# Patient Record
Sex: Female | Born: 1995 | Race: Black or African American | Hispanic: No | Marital: Single | Smoking: Never smoker
Health system: Southern US, Community
[De-identification: ages and names within clinical notes are randomized; demographics above are authoritative.]

---

## 2017-03-03 ENCOUNTER — Encounter (HOSPITAL_COMMUNITY): Payer: Self-pay | Admitting: Emergency Medicine

## 2017-03-03 ENCOUNTER — Ambulatory Visit (HOSPITAL_COMMUNITY)
Admission: EM | Admit: 2017-03-03 | Discharge: 2017-03-03 | Disposition: A | Discharge status: Home / Self Care | Payer: BLUE CROSS/BLUE SHIELD | Attending: Internal Medicine | Admitting: Internal Medicine

## 2017-03-03 DIAGNOSIS — J069 Acute upper respiratory infection, unspecified: Secondary | ICD-10-CM

## 2017-03-03 DIAGNOSIS — B9789 Other viral agents as the cause of diseases classified elsewhere: Secondary | ICD-10-CM | POA: Diagnosis not present

## 2017-03-03 NOTE — Discharge Instructions (Signed)
Push fluids to ensure adequate hydration and keep secretions thin.  Tylenol and/or ibuprofen as needed for pain or fevers.  Continue with dayquil/nyquil or other over the counter treatments as needed to help with symptoms as your body continues to fight this virus. If symptoms worsen or do not improve in the next week to return to be seen or to follow up with your primary care provider.

## 2017-03-03 NOTE — ED Triage Notes (Signed)
PT C/O: cold sx onset 3 days associated w/vomiting, sore throat, hot flashes, nasal drainage/congestion, dry cough  TAKING MEDS: none   A&O x4... NAD... Ambulatory

## 2017-03-03 NOTE — ED Provider Notes (Signed)
[facility]    CSN: [id number] Arrival date & time: 03/03/17  1004     History   Chief Complaint Chief Complaint  Patient presents with  . URI    HPI Maureen Pitts is a 22 y.o. female.   Maureen Pitts presents with complaints of body aches, productive cough, sore throat which started two days ago but worsened yesterday. Had episode of vomiting last night. Denies abdominal pain or nausea today. Decreased appetite. No known fevers. Took nyquil which helped over night. Occasional headache but denies currently. Without ear pain. Has a nexplanon, no other medical history or medications. Classmates she has been around have had illness. Did not get a flu vaccine this season.   ROS per HPI.       History reviewed. No pertinent past medical history.  There are no active problems to display for this patient.   History reviewed. No pertinent surgical history.  OB History    No data available       Home Medications    Prior to Admission medications   Medication Sig Start Date End Date Taking? Authorizing Provider  Etonogestrel (NEXPLANON [place]) Inject into the skin.   Yes [provider]    Family History History reviewed. No pertinent family history.  Social History Social History   Tobacco Use  . Smoking status: Never Smoker  . Smokeless tobacco: Never Used  Substance Use Topics  . Alcohol use: Not on file  . Drug use: Not on file     Allergies   Patient has no known allergies.   Review of Systems Review of Systems   Physical Exam Triage Vital Signs ED Triage Vitals [03/03/17 1048]  Enc Vitals Group     BP 105/60     Pulse Rate 64     Resp 18     Temp 98.6 F (37 C)     Temp Source Oral     SpO2 100 %     Weight      Height      Head Circumference      Peak Flow      Pain Score      Pain Loc      Pain Edu?      Excl. in GC?    No data found.  Updated Vital Signs BP 105/60 (BP Location: Left Arm)   Pulse 64    Temp 98.6 F (37 C) (Oral)   Resp 18   SpO2 100%   Visual Acuity Right Eye Distance:   Left Eye Distance:   Bilateral Distance:    Right Eye Near:   Left Eye Near:    Bilateral Near:     Physical Exam  Constitutional: She is oriented to person, place, and time. She appears well-developed and well-nourished. No distress.  HENT:  Head: Normocephalic and atraumatic.  Right Ear: Tympanic membrane, external ear and ear canal normal.  Left Ear: Tympanic membrane, external ear and ear canal normal.  Nose: Nose normal.  Mouth/Throat: Uvula is midline, oropharynx is clear and moist and mucous membranes are normal. No tonsillar exudate.  Eyes: Conjunctivae and EOM are normal. Pupils are equal, round, and reactive to light.  Cardiovascular: Normal rate, regular rhythm and normal heart sounds.  Pulmonary/Chest: Effort normal and breath sounds normal.  Abdominal: There is no tenderness.  Lymphadenopathy:    She has no cervical adenopathy.  Neurological: She is alert and oriented to person, place, and time.  Skin: Skin is warm  and dry.     UC Treatments / Results  Labs (all labs ordered are listed, but only abnormal results are displayed) Labs Reviewed - No data to display  EKG  EKG Interpretation None       Radiology No results found.  Procedures Procedures (including critical care time)  Medications Ordered in UC Medications - No data to display   Initial Impression / Assessment and Plan / UC Course  I have reviewed the triage vital signs and the nursing notes.  Pertinent labs & imaging results that were available during my care of the patient were reviewed by me and considered in my medical decision making (see chart for details).     Without acute findings on exam. Non toxic in appearance. Afebrile, without tachypnea, tachycardia or hypoxia. History and physical consistent with viral illness.  Supportive cares recommended. Patient verbalized understanding and  agreeable to plan.    Final Clinical Impressions(s) / UC Diagnoses   Final diagnoses:  Viral URI with cough    ED Discharge Orders    None       Controlled Substance Prescriptions [place] Controlled Substance Registry consulted? Not Applicable   Georgetta Haber, NP 03/03/17 1158

## 2019-04-05 ENCOUNTER — Ambulatory Visit: Payer: BLUE CROSS/BLUE SHIELD | Attending: Family

## 2019-04-05 DIAGNOSIS — Z23 Encounter for immunization: Secondary | ICD-10-CM

## 2019-04-05 NOTE — Progress Notes (Signed)
   Covid-19 Vaccination Clinic  Name:  Maureen Pitts    MRN: [id number] DOB: [birth year]  04/05/2019  [name] was observed post Covid-19 immunization for 15 minutes without incident. She was provided with Vaccine Information Sheet and instruction to access the V-Safe system.   [name] was instructed to call 911 with any severe reactions post vaccine: Marland Kitchen Difficulty breathing  . Swelling of face and throat  . A fast heartbeat  . A bad rash all over body  . Dizziness and weakness   Immunizations Administered    Name Date Dose VIS Date Route   Moderna COVID-19 Vaccine 04/05/2019  3:41 PM 0.5 mL 12/19/2018 Intramuscular   Manufacturer: Moderna   Lot: [id number]   NDC: 84132-440-10

## 2019-05-08 ENCOUNTER — Ambulatory Visit: Payer: BLUE CROSS/BLUE SHIELD | Attending: Family

## 2019-05-08 DIAGNOSIS — Z23 Encounter for immunization: Secondary | ICD-10-CM

## 2019-05-08 NOTE — Progress Notes (Signed)
   Covid-19 Vaccination Clinic  Name:  Jessey Huyett    MRN: [id number] DOB: Jun 05, 1995  05/08/2019  Ms. Raphael was observed post Covid-19 immunization for 15 minutes without incident. She was provided with Vaccine Information Sheet and instruction to access the V-Safe system.   Ms. Kluth was instructed to call 911 with any severe reactions post vaccine: Marland Kitchen Difficulty breathing  . Swelling of face and throat  . A fast heartbeat  . A bad rash all over body  . Dizziness and weakness   Immunizations Administered    Name Date Dose VIS Date Route   Moderna COVID-19 Vaccine 05/08/2019  3:29 PM 0.5 mL 12/2018 Intramuscular   Manufacturer: Moderna   Lot: [id number]   NDC: 20891-002-62

## 2020-01-24 ENCOUNTER — Telehealth: Payer: Self-pay | Admitting: General Practice

## 2020-01-24 NOTE — Telephone Encounter (Signed)
Patient sent e-mail request to establish care here at our office. LVM to give the office a call to schedule.

## 2020-01-29 ENCOUNTER — Ambulatory Visit: Payer: BLUE CROSS/BLUE SHIELD | Attending: Internal Medicine

## 2020-01-29 DIAGNOSIS — Z23 Encounter for immunization: Secondary | ICD-10-CM

## 2020-01-29 NOTE — Progress Notes (Signed)
   Covid-19 Vaccination Clinic  Name:  Maureen Pitts    MRN: [id number] DOB: [birth year]  01/29/2020  Maureen Pitts was observed post Covid-19 immunization for 15 minutes without incident. She was provided with Vaccine Information Sheet and instruction to access the V-Safe system.   Maureen Pitts was instructed to call 911 with any severe reactions post vaccine: Marland Kitchen Difficulty breathing  . Swelling of face and throat  . A fast heartbeat  . A bad rash all over body  . Dizziness and weakness   Immunizations Administered    Name Date Dose VIS Date Route   Moderna COVID-19 Vaccine 01/29/2020  3:50 PM 0.5 mL 11/07/2019 Intramuscular   Manufacturer: Gala Murdoch   Lot: [id number]   NDC: 94801-655-37

## 2020-03-15 ENCOUNTER — Emergency Department (HOSPITAL_COMMUNITY)
Admission: EM | Admit: 2020-03-15 | Discharge: 2020-03-16 | Disposition: A | Discharge status: Home / Self Care | Payer: BLUE CROSS/BLUE SHIELD | Attending: Emergency Medicine | Admitting: Emergency Medicine

## 2020-03-15 ENCOUNTER — Emergency Department (HOSPITAL_COMMUNITY): Payer: BLUE CROSS/BLUE SHIELD

## 2020-03-15 ENCOUNTER — Encounter (HOSPITAL_COMMUNITY): Payer: Self-pay | Admitting: Emergency Medicine

## 2020-03-15 ENCOUNTER — Other Ambulatory Visit: Payer: Self-pay

## 2020-03-15 DIAGNOSIS — S8991XA Unspecified injury of right lower leg, initial encounter: Secondary | ICD-10-CM | POA: Diagnosis present

## 2020-03-15 DIAGNOSIS — S86911A Strain of unspecified muscle(s) and tendon(s) at lower leg level, right leg, initial encounter: Secondary | ICD-10-CM | POA: Diagnosis not present

## 2020-03-15 DIAGNOSIS — Y92410 Unspecified street and highway as the place of occurrence of the external cause: Secondary | ICD-10-CM | POA: Insufficient documentation

## 2020-03-15 DIAGNOSIS — T148XXA Other injury of unspecified body region, initial encounter: Secondary | ICD-10-CM

## 2020-03-15 DIAGNOSIS — S8011XA Contusion of right lower leg, initial encounter: Secondary | ICD-10-CM
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
## 2020-03-17 ENCOUNTER — Encounter (HOSPITAL_COMMUNITY): Payer: Self-pay

## 2020-03-17 ENCOUNTER — Other Ambulatory Visit: Payer: Self-pay

## 2020-03-17 ENCOUNTER — Emergency Department (HOSPITAL_COMMUNITY)
Admission: EM | Admit: 2020-03-17 | Discharge: 2020-03-17 | Disposition: A | Discharge status: Home / Self Care | Payer: BLUE CROSS/BLUE SHIELD | Attending: Emergency Medicine | Admitting: Emergency Medicine

## 2020-03-17 ENCOUNTER — Emergency Department (HOSPITAL_COMMUNITY): Payer: BLUE CROSS/BLUE SHIELD

## 2020-03-17 DIAGNOSIS — R519 Headache, unspecified: Secondary | ICD-10-CM | POA: Diagnosis not present

## 2020-03-17 DIAGNOSIS — Y92410 Unspecified street and highway as the place of occurrence of the external cause: Secondary | ICD-10-CM | POA: Insufficient documentation

## 2020-03-17 DIAGNOSIS — M25512 Pain in left shoulder: Secondary | ICD-10-CM | POA: Diagnosis not present

## 2020-03-17 DIAGNOSIS — R0781 Pleurodynia: Secondary | ICD-10-CM | POA: Insufficient documentation

## 2020-03-17 DIAGNOSIS — R42 Dizziness and giddiness: Secondary | ICD-10-CM | POA: Insufficient documentation

## 2020-03-17 DIAGNOSIS — M898X1 Other specified disorders of bone, shoulder: Secondary | ICD-10-CM
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
## 2020-03-17 NOTE — Discharge Instructions (Addendum)
Take Motrin Tylenol as needed as directed for pain. Apply warm compresses to sore muscles.  Follow-up with your doctor if pain persists.

## 2020-03-18 ENCOUNTER — Emergency Department (HOSPITAL_COMMUNITY)
Admission: EM | Admit: 2020-03-18 | Discharge: 2020-03-18 | Disposition: A | Discharge status: Home / Self Care | Payer: BLUE CROSS/BLUE SHIELD | Attending: Emergency Medicine | Admitting: Emergency Medicine

## 2020-03-18 ENCOUNTER — Emergency Department (HOSPITAL_COMMUNITY): Payer: BLUE CROSS/BLUE SHIELD

## 2020-03-18 ENCOUNTER — Other Ambulatory Visit: Payer: Self-pay

## 2020-03-18 DIAGNOSIS — R110 Nausea: Secondary | ICD-10-CM | POA: Diagnosis not present

## 2020-03-18 DIAGNOSIS — S199XXD Unspecified injury of neck, subsequent encounter: Secondary | ICD-10-CM | POA: Diagnosis present

## 2020-03-18 DIAGNOSIS — R42 Dizziness and giddiness: Secondary | ICD-10-CM | POA: Diagnosis not present

## 2020-03-18 DIAGNOSIS — H53149 Visual discomfort, unspecified: Secondary | ICD-10-CM | POA: Diagnosis not present

## 2020-03-18 DIAGNOSIS — R0789 Other chest pain: Secondary | ICD-10-CM | POA: Diagnosis not present

## 2020-03-18 DIAGNOSIS — G44319 Acute post-traumatic headache, not intractable: Secondary | ICD-10-CM

## 2020-03-18 DIAGNOSIS — S161XXA Strain of muscle, fascia and tendon at neck level, initial encounter: Secondary | ICD-10-CM

## 2020-03-18 DIAGNOSIS — S161XXD Strain of muscle, fascia and tendon at neck level, subsequent encounter: Secondary | ICD-10-CM | POA: Insufficient documentation

## 2020-03-18 MED ORDER — NAPROXEN 500 MG PO TABS: 500.0000 mg | ORAL_TABLET | Freq: Two times a day (BID) | ORAL | 0 refills | Status: AC

## 2020-03-18 MED ORDER — METHOCARBAMOL 500 MG PO TABS: 500.0000 mg | ORAL_TABLET | Freq: Every evening | ORAL | 0 refills | Status: AC | PRN

## 2020-03-18 MED ORDER — ACETAMINOPHEN 500 MG PO TABS
1000.0000 mg | ORAL_TABLET | Freq: Once | ORAL | Status: AC
Start: 2020-03-18 — End: 2020-03-18
  Administered 2020-03-18: 1000 mg via ORAL
  Filled 2020-03-18: qty 2

## 2020-03-18 NOTE — Discharge Instructions (Signed)
Take naproxen 2 times a day with meals as needed for pain.  Do not take other anti-inflammatories at the same time (Advil, Motrin, naproxen, Aleve). You may supplement with Tylenol if you need further pain control. Use robaxin as needed for muscle stiffness or soreness.  Have caution, this may make you tired or groggy.  Do not drive or operate heavy machinery while taking this medicine. Use ice packs or heating pads if this helps control your pain. You will likely have continued muscle stiffness and soreness over the next couple days.  Follow-up with primary care/student health in 1 week if your symptoms are not improving. Return to the emergency room if you develop vision changes, vomiting, slurred speech, numbness, loss of bowel or bladder control, or any new or worsening symptoms.

## 2020-03-18 NOTE — ED Notes (Signed)
Patient transported to CT 
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
## 2021-10-14 ENCOUNTER — Telehealth: Payer: BLUE CROSS/BLUE SHIELD | Admitting: Physician Assistant

## 2021-10-14 DIAGNOSIS — B9689 Other specified bacterial agents as the cause of diseases classified elsewhere: Secondary | ICD-10-CM | POA: Diagnosis not present

## 2021-10-14 DIAGNOSIS — J019 Acute sinusitis, unspecified: Secondary | ICD-10-CM | POA: Diagnosis not present

## 2021-10-14 NOTE — Progress Notes (Signed)
I have spent 5 minutes in review of e-visit questionnaire, review and updating patient chart, medical decision making and response to patient.   Areebah Meinders Cody Nathaneal Sommers, PA-C    

## 2021-10-14 NOTE — Progress Notes (Signed)
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
